# Patient Record
Sex: Female | Born: 1975 | Race: White | Hispanic: No | Marital: Married | Smoking: Never smoker
Health system: Southern US, Community
[De-identification: ages and names within clinical notes are randomized; demographics above are authoritative.]

## PROBLEM LIST (undated history)

## (undated) DIAGNOSIS — K5190 Ulcerative colitis, unspecified, without complications: Secondary | ICD-10-CM

---

## 2002-12-16 ENCOUNTER — Other Ambulatory Visit: Admission: RE | Admit: 2002-12-16 | Discharge: 2002-12-16 | Payer: Self-pay | Admitting: Obstetrics and Gynecology

## 2003-02-26 ENCOUNTER — Inpatient Hospital Stay (HOSPITAL_COMMUNITY): Admission: AD | Admit: 2003-02-26 | Discharge: 2003-02-26 | Payer: Self-pay | Admitting: Obstetrics and Gynecology

## 2003-02-26 ENCOUNTER — Encounter: Payer: Self-pay | Admitting: Obstetrics and Gynecology

## 2003-07-23 ENCOUNTER — Inpatient Hospital Stay (HOSPITAL_COMMUNITY): Admission: AD | Admit: 2003-07-23 | Discharge: 2003-07-25 | Payer: Self-pay | Admitting: Obstetrics and Gynecology

## 2003-07-28 ENCOUNTER — Encounter: Admission: RE | Admit: 2003-07-28 | Discharge: 2003-08-27 | Payer: Self-pay | Admitting: Obstetrics and Gynecology

## 2003-08-31 ENCOUNTER — Other Ambulatory Visit: Admission: RE | Admit: 2003-08-31 | Discharge: 2003-08-31 | Payer: Self-pay | Admitting: Obstetrics and Gynecology

## 2003-09-27 ENCOUNTER — Encounter: Admission: RE | Admit: 2003-09-27 | Discharge: 2003-10-27 | Payer: Self-pay | Admitting: Obstetrics and Gynecology

## 2003-11-27 ENCOUNTER — Encounter: Admission: RE | Admit: 2003-11-27 | Discharge: 2003-12-27 | Payer: Self-pay | Admitting: Obstetrics and Gynecology

## 2003-12-28 ENCOUNTER — Encounter: Admission: RE | Admit: 2003-12-28 | Discharge: 2004-01-27 | Payer: Self-pay | Admitting: Obstetrics and Gynecology

## 2004-02-25 ENCOUNTER — Encounter: Admission: RE | Admit: 2004-02-25 | Discharge: 2004-03-26 | Payer: Self-pay | Admitting: Obstetrics and Gynecology

## 2004-04-26 ENCOUNTER — Encounter: Admission: RE | Admit: 2004-04-26 | Discharge: 2004-05-26 | Payer: Self-pay | Admitting: Obstetrics and Gynecology

## 2004-06-26 ENCOUNTER — Encounter: Admission: RE | Admit: 2004-06-26 | Discharge: 2004-07-26 | Payer: Self-pay | Admitting: Obstetrics and Gynecology

## 2004-07-27 ENCOUNTER — Encounter: Admission: RE | Admit: 2004-07-27 | Discharge: 2004-08-26 | Payer: Self-pay | Admitting: Obstetrics and Gynecology

## 2004-09-20 ENCOUNTER — Other Ambulatory Visit: Admission: RE | Admit: 2004-09-20 | Discharge: 2004-09-20 | Payer: Self-pay | Admitting: Obstetrics and Gynecology

## 2005-07-17 ENCOUNTER — Other Ambulatory Visit: Admission: RE | Admit: 2005-07-17 | Discharge: 2005-07-17 | Payer: Self-pay | Admitting: Obstetrics and Gynecology

## 2006-01-22 ENCOUNTER — Inpatient Hospital Stay (HOSPITAL_COMMUNITY): Admission: AD | Admit: 2006-01-22 | Discharge: 2006-01-22 | Payer: Self-pay | Admitting: Obstetrics and Gynecology

## 2006-02-03 ENCOUNTER — Inpatient Hospital Stay (HOSPITAL_COMMUNITY): Admission: AD | Admit: 2006-02-03 | Discharge: 2006-02-05 | Payer: Self-pay | Admitting: Obstetrics and Gynecology

## 2006-02-06 ENCOUNTER — Encounter: Admission: RE | Admit: 2006-02-06 | Discharge: 2006-03-08 | Payer: Self-pay | Admitting: Obstetrics and Gynecology

## 2006-03-09 ENCOUNTER — Encounter: Admission: RE | Admit: 2006-03-09 | Discharge: 2006-04-07 | Payer: Self-pay | Admitting: Obstetrics and Gynecology

## 2006-04-08 ENCOUNTER — Encounter: Admission: RE | Admit: 2006-04-08 | Discharge: 2006-05-08 | Payer: Self-pay | Admitting: Obstetrics and Gynecology

## 2006-05-09 ENCOUNTER — Encounter: Admission: RE | Admit: 2006-05-09 | Discharge: 2006-06-07 | Payer: Self-pay | Admitting: Obstetrics and Gynecology

## 2006-06-08 ENCOUNTER — Encounter: Admission: RE | Admit: 2006-06-08 | Discharge: 2006-07-08 | Payer: Self-pay | Admitting: Obstetrics and Gynecology

## 2006-07-09 ENCOUNTER — Encounter: Admission: RE | Admit: 2006-07-09 | Discharge: 2006-08-08 | Payer: Self-pay | Admitting: Obstetrics and Gynecology

## 2006-08-09 ENCOUNTER — Encounter: Admission: RE | Admit: 2006-08-09 | Discharge: 2006-09-07 | Payer: Self-pay | Admitting: Obstetrics and Gynecology

## 2006-09-08 ENCOUNTER — Encounter: Admission: RE | Admit: 2006-09-08 | Discharge: 2006-10-08 | Payer: Self-pay | Admitting: Obstetrics and Gynecology

## 2006-10-09 ENCOUNTER — Encounter: Admission: RE | Admit: 2006-10-09 | Discharge: 2006-11-07 | Payer: Self-pay | Admitting: Obstetrics and Gynecology

## 2006-11-08 ENCOUNTER — Encounter: Admission: RE | Admit: 2006-11-08 | Discharge: 2006-12-08 | Payer: Self-pay | Admitting: Obstetrics and Gynecology

## 2006-12-09 ENCOUNTER — Encounter: Admission: RE | Admit: 2006-12-09 | Discharge: 2007-01-08 | Payer: Self-pay | Admitting: Obstetrics and Gynecology

## 2007-01-09 ENCOUNTER — Encounter: Admission: RE | Admit: 2007-01-09 | Discharge: 2007-02-06 | Payer: Self-pay | Admitting: Obstetrics and Gynecology

## 2007-02-07 ENCOUNTER — Encounter: Admission: RE | Admit: 2007-02-07 | Discharge: 2007-03-09 | Payer: Self-pay | Admitting: Obstetrics and Gynecology

## 2007-03-10 ENCOUNTER — Encounter: Admission: RE | Admit: 2007-03-10 | Discharge: 2007-04-08 | Payer: Self-pay | Admitting: Obstetrics and Gynecology

## 2007-04-09 ENCOUNTER — Encounter: Admission: RE | Admit: 2007-04-09 | Discharge: 2007-05-09 | Payer: Self-pay | Admitting: Obstetrics and Gynecology

## 2007-05-10 ENCOUNTER — Encounter: Admission: RE | Admit: 2007-05-10 | Discharge: 2007-06-08 | Payer: Self-pay | Admitting: Obstetrics and Gynecology

## 2011-01-29 ENCOUNTER — Other Ambulatory Visit: Payer: Self-pay | Admitting: Family Medicine

## 2011-01-29 ENCOUNTER — Ambulatory Visit
Admission: RE | Admit: 2011-01-29 | Discharge: 2011-01-29 | Disposition: A | Discharge status: Home / Self Care | Payer: 59 | Source: Ambulatory Visit | Attending: Family Medicine | Admitting: Family Medicine

## 2011-01-29 DIAGNOSIS — R911 Solitary pulmonary nodule: Secondary | ICD-10-CM

## 2012-07-06 ENCOUNTER — Other Ambulatory Visit: Payer: Self-pay | Admitting: Obstetrics and Gynecology

## 2012-07-06 DIAGNOSIS — R928 Other abnormal and inconclusive findings on diagnostic imaging of breast: Secondary | ICD-10-CM

## 2012-07-10 ENCOUNTER — Ambulatory Visit
Admission: RE | Admit: 2012-07-10 | Discharge: 2012-07-10 | Disposition: A | Discharge status: Home / Self Care | Payer: 59 | Source: Ambulatory Visit | Attending: Obstetrics and Gynecology | Admitting: Obstetrics and Gynecology

## 2012-07-10 DIAGNOSIS — R928 Other abnormal and inconclusive findings on diagnostic imaging of breast: Secondary | ICD-10-CM

## 2018-10-04 ENCOUNTER — Emergency Department (HOSPITAL_BASED_OUTPATIENT_CLINIC_OR_DEPARTMENT_OTHER): Payer: 59

## 2018-10-04 ENCOUNTER — Other Ambulatory Visit: Payer: Self-pay

## 2018-10-04 ENCOUNTER — Emergency Department (HOSPITAL_BASED_OUTPATIENT_CLINIC_OR_DEPARTMENT_OTHER)
Admission: EM | Admit: 2018-10-04 | Discharge: 2018-10-04 | Disposition: A | Discharge status: Home / Self Care | Payer: 59 | Attending: Emergency Medicine | Admitting: Emergency Medicine

## 2018-10-04 ENCOUNTER — Encounter: Payer: Self-pay | Admitting: Emergency Medicine

## 2018-10-04 DIAGNOSIS — R1032 Left lower quadrant pain: Secondary | ICD-10-CM | POA: Diagnosis present

## 2018-10-04 DIAGNOSIS — R1012 Left upper quadrant pain: Secondary | ICD-10-CM | POA: Insufficient documentation

## 2018-10-04 HISTORY — DX: Ulcerative colitis, unspecified, without complications: K51.90

## 2018-10-04 LAB — CBC WITH DIFFERENTIAL/PLATELET
Abs Immature Granulocytes: 0.02 10*3/uL (ref 0.00–0.07)
BASOS ABS: 0 10*3/uL (ref 0.0–0.1)
Basophils Relative: 1 %
EOS PCT: 6 %
Eosinophils Absolute: 0.3 10*3/uL (ref 0.0–0.5)
HEMATOCRIT: 39.6 % (ref 36.0–46.0)
HEMOGLOBIN: 12.2 g/dL (ref 12.0–15.0)
Immature Granulocytes: 1 %
LYMPHS ABS: 1.3 10*3/uL (ref 0.7–4.0)
LYMPHS PCT: 31 %
MCH: 26.6 pg (ref 26.0–34.0)
MCHC: 30.8 g/dL (ref 30.0–36.0)
MCV: 86.5 fL (ref 80.0–100.0)
Monocytes Absolute: 0.2 10*3/uL (ref 0.1–1.0)
Monocytes Relative: 6 %
NEUTROS ABS: 2.3 10*3/uL (ref 1.7–7.7)
NRBC: 0 % (ref 0.0–0.2)
Neutrophils Relative %: 55 %
Platelets: 344 10*3/uL (ref 150–400)
RBC: 4.58 MIL/uL (ref 3.87–5.11)
RDW: 13.2 % (ref 11.5–15.5)
WBC: 4.1 10*3/uL (ref 4.0–10.5)

## 2018-10-04 LAB — URINALYSIS, ROUTINE W REFLEX MICROSCOPIC
BILIRUBIN URINE: NEGATIVE
GLUCOSE, UA: NEGATIVE mg/dL
Hgb urine dipstick: NEGATIVE
Ketones, ur: NEGATIVE mg/dL
Leukocytes, UA: NEGATIVE
NITRITE: NEGATIVE
PH: 6.5 (ref 5.0–8.0)
Protein, ur: NEGATIVE mg/dL
SPECIFIC GRAVITY, URINE: 1.02 (ref 1.005–1.030)

## 2018-10-04 LAB — COMPREHENSIVE METABOLIC PANEL
ALBUMIN: 4 g/dL (ref 3.5–5.0)
ALK PHOS: 55 U/L (ref 38–126)
ALT: 20 U/L (ref 0–44)
ANION GAP: 5 (ref 5–15)
AST: 21 U/L (ref 15–41)
BILIRUBIN TOTAL: 0.6 mg/dL (ref 0.3–1.2)
BUN: 11 mg/dL (ref 6–20)
CALCIUM: 9.2 mg/dL (ref 8.9–10.3)
CO2: 25 mmol/L (ref 22–32)
Chloride: 107 mmol/L (ref 98–111)
Creatinine, Ser: 0.83 mg/dL (ref 0.44–1.00)
GFR calc Af Amer: >60 mL/min (ref >60)
GFR calc non Af Amer: >60 mL/min (ref >60)
Glucose, Bld: 91 mg/dL (ref 70–99)
Potassium: 3.8 mmol/L (ref 3.5–5.1)
SODIUM: 137 mmol/L (ref 135–145)
TOTAL PROTEIN: 7.2 g/dL (ref 6.5–8.1)

## 2018-10-04 LAB — TROPONIN I

## 2018-10-04 LAB — PREGNANCY, URINE: Preg Test, Ur: NEGATIVE

## 2018-10-04 LAB — LIPASE, BLOOD: Lipase: 26 U/L (ref 11–51)

## 2018-10-04 MED ORDER — SUCRALFATE 1 GM/10ML PO SUSP: 1.0000 g | Freq: Three times a day (TID) | ORAL | 0 refills | Status: AC

## 2018-10-04 MED ORDER — ONDANSETRON HCL 4 MG/2ML IJ SOLN
4.0000 mg | Freq: Once | INTRAMUSCULAR | Status: AC
  Administered 2018-10-04: 4 mg via INTRAVENOUS
  Filled 2018-10-04: qty 2

## 2018-10-04 MED ORDER — ALUM & MAG HYDROXIDE-SIMETH 200-200-20 MG/5ML PO SUSP
30.0000 mL | Freq: Once | ORAL | Status: AC
  Administered 2018-10-04: 30 mL via ORAL
  Filled 2018-10-04: qty 30

## 2018-10-04 MED ORDER — FENTANYL CITRATE (PF) 100 MCG/2ML IJ SOLN
50.0000 ug | Freq: Once | INTRAMUSCULAR | Status: AC
  Administered 2018-10-04: 50 ug via INTRAVENOUS
  Filled 2018-10-04: qty 2

## 2018-10-04 MED ORDER — ONDANSETRON HCL 4 MG PO TABS: 4.0000 mg | ORAL_TABLET | Freq: Four times a day (QID) | ORAL | 0 refills | Status: AC

## 2018-10-04 MED ORDER — ALBUTEROL (5 MG/ML) CONTINUOUS INHALATION SOLN
INHALATION_SOLUTION | RESPIRATORY_TRACT | Status: AC
  Filled 2018-10-04: qty 20

## 2018-10-04 MED ORDER — IOPAMIDOL (ISOVUE-300) INJECTION 61%
100.0000 mL | Freq: Once | INTRAVENOUS | Status: AC | PRN
  Administered 2018-10-04: 100 mL via INTRAVENOUS

## 2018-10-04 MED ORDER — SODIUM CHLORIDE 0.9 % IV BOLUS
1000.0000 mL | Freq: Once | INTRAVENOUS | Status: AC
  Administered 2018-10-04: 1000 mL via INTRAVENOUS

## 2018-10-04 MED ORDER — LIDOCAINE VISCOUS HCL 2 % MT SOLN
15.0000 mL | Freq: Once | OROMUCOSAL | Status: AC
  Administered 2018-10-04: 15 mL via ORAL
  Filled 2018-10-04: qty 15

## 2018-10-04 MED ORDER — MORPHINE SULFATE (PF) 4 MG/ML IV SOLN
4.0000 mg | Freq: Once | INTRAVENOUS | Status: AC
  Administered 2018-10-04: 4 mg via INTRAVENOUS
  Filled 2018-10-04: qty 1

## 2018-10-04 MED ORDER — OMEPRAZOLE 20 MG PO CPDR: 20.0000 mg | DELAYED_RELEASE_CAPSULE | Freq: Two times a day (BID) | ORAL | 0 refills | Status: AC

## 2018-10-04 NOTE — Discharge Instructions (Signed)
Take the omeprazole and Carafate as directed.  Take Zofran as directed.  Follow-up with Dr. Marigene EhlersLee's office.  Call his office tomorrow to arrange for an appointment.  Return to emergency department for any worsening pain, fevers, difficulty breathing, chest pain or any other worsening concerning symptoms.

## 2018-10-04 NOTE — ED Triage Notes (Signed)
Presents with left flank and hip pain that began last night and is consistent. The patient has been unable to get comfortable/ Bending forward and rolling over make pain worse. Laying on stomach make pain better. Endorses nausea and vomiting. History of ulcerative colitis.

## 2018-10-04 NOTE — ED Provider Notes (Signed)
[facility] EMERGENCY DEPARTMENT Provider Note   CSN: [id number] Arrival date & time: 10/04/18  1102     History   Chief Complaint Chief Complaint  Patient presents with  . Flank Pain    HPI Holly Macias is a 42 y.o. female possible history of ulcerative colitis who presents for evaluation of left flank pain that is now rating up to the left upper quadrant pain that began yesterday.  Patient reports that she started having some left flank pain that radiated around to the left side, left hip.  She reports that overnight, the pain got worse and started radiating up towards her left abdomen and into her left upper quadrant/gastric region.  She states that this extends up into underneath her breast.  Her pain is not worse with deep inspiration or with exertion.  She denies any associated shortness of breath.  She states that the pain is worse when she moves or bends.  Patient states that the pain was also worsened by eating.  She reports nausea and vomiting.  Emesis was nonbloody, nonbilious.  Patient reports she did not take any medication for the pain.  Patient reports she is also having some heartburn-like symptoms where she feels like there is burning coming from the pain in her left upper quadrant.  Her pain is not worse with deep inspiration.  She denies any associated shortness of breath.  Patient states that she has not had any fever since onset of symptoms.  Patient denies any chest pain, difficulty breathing, dysuria, hematuria.  Her last bowel movement was this morning and was normal.  She did have blood in stool but she states that is normal for her.  She denies any OCP use, recent immobilization, prior history of DVT/PE, recent surgery, leg swelling, or long travel.  The history is provided by the patient.    Past Medical History:  Diagnosis Date  . Ulcerative colitis (HCC)     There are no active problems to display for this patient.   History reviewed. No  pertinent surgical history.   OB History   None      Home Medications    Prior to Admission medications   Medication Sig Start Date End Date Taking? Authorizing Provider  omeprazole (PRILOSEC) 20 MG capsule Take 1 capsule (20 mg total) by mouth 2 (two) times daily before a meal for 15 days. 10/04/18 10/19/18  Graciella FreerLayden, Lindsey A, PA-C  ondansetron (ZOFRAN) 4 MG tablet Take 1 tablet (4 mg total) by mouth every 6 (six) hours. 10/04/18   Maxwell CaulLayden, Lindsey A, PA-C  sucralfate (CARAFATE) 1 GM/10ML suspension Take 10 mLs (1 g total) by mouth 4 (four) times daily -  with meals and at bedtime. 10/04/18   Maxwell CaulLayden, Lindsey A, PA-C    Family History History reviewed. No pertinent family history.  Social History Social History   Tobacco Use  . Smoking status: Never Smoker  . Smokeless tobacco: Never Used  Substance Use Topics  . Alcohol use: Not Currently  . Drug use: Never     Allergies   Patient has no known allergies.   Review of Systems Review of Systems  Constitutional: Negative for fever.  Respiratory: Negative for cough and shortness of breath.   Cardiovascular: Negative for chest pain.  Gastrointestinal: Positive for abdominal pain, blood in stool, nausea and vomiting. Negative for constipation and diarrhea.  Genitourinary: Negative for dysuria and hematuria.  Neurological: Negative for headaches.  All other systems reviewed and are negative.  Physical Exam Updated Vital Signs BP 110/63   Pulse 73   Temp 98.1 F (36.7 C) (Oral)   Resp 19   Ht 5\' 2"  (1.575 m)   Wt 83.9 kg   SpO2 100%   BMI 33.84 kg/m   Physical Exam  Constitutional: She is oriented to person, place, and time. She appears well-developed and well-nourished.  HENT:  Head: Normocephalic and atraumatic.  Mouth/Throat: Oropharynx is clear and moist and mucous membranes are normal.  Eyes: Pupils are equal, round, and reactive to light. Conjunctivae, EOM and lids are normal.  Neck: Full passive range  of motion without pain.  Cardiovascular: Normal rate, regular rhythm, normal heart sounds and normal pulses. Exam reveals no gallop and no friction rub.  No murmur heard. Pulmonary/Chest: Effort normal and breath sounds normal.  Lungs clear to auscultation bilaterally.  Symmetric chest rise.  No wheezing, rales, rhonchi.  Abdominal: Soft. Normal appearance and bowel sounds are normal. There is generalized tenderness and tenderness in the left upper quadrant and left lower quadrant. There is CVA tenderness. There is no rigidity, no guarding and no tenderness at McBurney's point.  Abdomen is soft, nondistended.  Tenderness noted throughout the entire abdomen but is worse in the left upper quadrant.  Bilateral mild CVA tenderness, left greater than right.  Musculoskeletal: Normal range of motion.  Neurological: She is alert and oriented to person, place, and time.  Skin: Skin is warm and dry. Capillary refill takes less than 2 seconds.  Psychiatric: She has a normal mood and affect. Her speech is normal.  Nursing note and vitals reviewed.    ED Treatments / Results  Labs (all labs ordered are listed, but only abnormal results are displayed) Labs Reviewed  CBC WITH DIFFERENTIAL/PLATELET  COMPREHENSIVE METABOLIC PANEL  LIPASE, BLOOD  URINALYSIS, ROUTINE W REFLEX MICROSCOPIC  PREGNANCY, URINE  TROPONIN I    EKG None  Radiology Dg Chest 2 View  Result Date: 10/04/2018 CLINICAL DATA:  LEFT rib pain since last night. EXAM: CHEST - 2 VIEW COMPARISON:  Chest radiograph January 29, 2011 FINDINGS: Cardiomediastinal silhouette is normal. No pleural effusions or focal consolidations. Trachea projects midline and there is no pneumothorax. Soft tissue planes and included osseous structures are non-suspicious. IMPRESSION: Normal chest. Electronically Signed   By: Awilda Metro M.D.   On: 10/04/2018 14:21   Ct Abdomen Pelvis W Contrast  Result Date: 10/04/2018 CLINICAL DATA:  LEFT hip pain  radiating to LEFT flank for 2 days. History of ulcerative colitis. EXAM: CT ABDOMEN AND PELVIS WITH CONTRAST TECHNIQUE: Multidetector CT imaging of the abdomen and pelvis was performed using the standard protocol following bolus administration of intravenous contrast. CONTRAST:  ISOVUE-300 IOPAMIDOL (ISOVUE-300) INJECTION 61% COMPARISON:  None. FINDINGS: LOWER CHEST: Lung bases are clear. Included heart size is normal. No pericardial effusion. HEPATOBILIARY: Liver and gallbladder are normal. Mild focal fatty infiltration about the falciform ligament. PANCREAS: Normal. SPLEEN: Normal. ADRENALS/URINARY TRACT: Kidneys are orthotopic, demonstrating symmetric enhancement. No nephrolithiasis, hydronephrosis or solid renal masses. Too small to characterize hypodensity lower pole LEFT kidney. The unopacified ureters are normal in course and caliber. Delayed imaging through the kidneys demonstrates symmetric prompt contrast excretion within the proximal urinary collecting system. Urinary bladder is partially distended and unremarkable. Normal adrenal glands. STOMACH/BOWEL: The stomach, small and large bowel are normal in course and caliber without inflammatory changes. Mild rectal wall thickening though assessment limited by lack of oral contrast. Normal appendix. VASCULAR/LYMPHATIC: Aortoiliac vessels are normal in course and caliber.  No lymphadenopathy by CT size criteria. Small perirectal lymph nodes are likely reactive. REPRODUCTIVE: Normal.  IUD in central uterus. OTHER: No intraperitoneal free fluid or free air. MUSCULOSKELETAL: Nonacute. Mild levoscoliosis on this nonweightbearing examination. Mild lower lumbar facet arthropathy. Mild symmetric degenerative change of the hips. IMPRESSION: 1. Mild rectal wall thickening without acute inflammation or acute intra-abdominal/pelvic process. Electronically Signed   By: Awilda Metro M.D.   On: 10/04/2018 14:19    Procedures Procedures (including critical care  time)  Medications Ordered in ED Medications  albuterol (PROVENTIL, VENTOLIN) (5 MG/ML) 0.5% continuous inhalation solution (has no administration in time range)  sodium chloride 0.9 % bolus 1,000 mL (0 mLs Intravenous Stopped 10/04/18 1516)  morphine 4 MG/ML injection 4 mg (4 mg Intravenous Given 10/04/18 1314)  ondansetron (ZOFRAN) injection 4 mg (4 mg Intravenous Given 10/04/18 1313)  alum & mag hydroxide-simeth (MAALOX/MYLANTA) 200-200-20 MG/5ML suspension 30 mL (30 mLs Oral Given 10/04/18 1324)    And  lidocaine (XYLOCAINE) 2 % viscous mouth solution 15 mL (15 mLs Oral Given 10/04/18 1324)  iopamidol (ISOVUE-300) 61 % injection 100 mL (100 mLs Intravenous Contrast Given 10/04/18 1341)  fentaNYL (SUBLIMAZE) injection 50 mcg (50 mcg Intravenous Given 10/04/18 1707)     Initial Impression / Assessment and Plan / ED Course  I have reviewed the triage vital signs and the nursing notes.  Pertinent labs & imaging results that were available during my care of the patient were reviewed by me and considered in my medical decision making (see chart for details).     42 year old female past with history of ulcerative colitis who presents for evaluation of abdominal pain her.  Reports initially was left flank pain that began last night and then went into the lower abdomen.  Since last night, started radiating up into the left upper quadrant, epigastric region just underneath her breast.  States it does not extend into her chest.  Pain not worse with deep inspiration or exertion.  She has no associated difficulty breathing.  She does report some associated nausea and vomiting.  No urinary complaints. Patient is afebrile, non-toxic appearing, sitting comfortably on examination table. Vital signs reviewed and stable.  On exam, she has tenderness noted to the CVA left greater than right.  Also with some tenderness noted to the left lateral side, left upper quadrant.  No McBurney's point tenderness.  No  lower abdominal tenderness.  Consider ulcerative colitis flare versus peptic ulcer disease versus infectious process.  History/physical exam is not concerning for ACS etiology but given the radiation, will plan to check EKG, troponin.  Not suspect PE given history/physical exam. Patient is PERC negative and is low risk for PE.  History/physical exam is not concerning for appendicitis, OB/GYN allergy.  Do not suspect ovarian torsion given history/physical exam.  Lipase normal.  CBC without any significant leukocytosis or anemia.  CMP is unremarkable.  UA without any infectious etiology. Urine pregnancy negative.  Troponin negative.  Chest x-ray negative for any acute infectious etiology.  CT abdomen pelvis shows normal appendix.  There is mild rectal wall thickening without acute inflammation or acute intra-abdominal pelvic process.  Reevaluation.  Patient reports improvement after GI cocktail and pain medication here in the ED.  We will plan to p.o. challenge.  Discussed patient with Dr. Higinio Roger (GI).  He recommends treating as possible ulcer with PPI and Carafate.  No indication to start any steroid or any other treatment today here in the ED.  He recommends that patient  call GI practice tomorrow morning to arrange an appointment with Dr. Nedra Hai.  Reevaluation.  Patient reports her pain started coming back after drinking but she has not had any vomiting.  I discussed with her regarding GIs recommendations.  Patient comfortable with this plan.  Repeat abdominal exam is improved.  Vital signs stable.  Patient stable for discharge at this time. Patient had ample opportunity for questions and discussion. All patient's questions were answered with full understanding. Strict return precautions discussed. Patient expresses understanding and agreement to plan.    Final Clinical Impressions(s) / ED Diagnoses   Final diagnoses:  Left upper quadrant pain    ED Discharge Orders         Ordered    omeprazole  (PRILOSEC) 20 MG capsule  2 times daily before meals     10/04/18 1649    sucralfate (CARAFATE) 1 GM/10ML suspension  3 times daily with meals & bedtime     10/04/18 1649    ondansetron (ZOFRAN) 4 MG tablet  Every 6 hours     10/04/18 1654           Maxwell Caul, PA-C 10/04/18 1938    Alvira Monday, MD 10/07/18 [phone]

## 2019-12-14 ENCOUNTER — Other Ambulatory Visit: Payer: Self-pay | Admitting: Obstetrics and Gynecology

## 2019-12-14 DIAGNOSIS — R928 Other abnormal and inconclusive findings on diagnostic imaging of breast: Secondary | ICD-10-CM

## 2019-12-15 ENCOUNTER — Ambulatory Visit
Admission: RE | Admit: 2019-12-15 | Discharge: 2019-12-15 | Disposition: A | Discharge status: Home / Self Care | Payer: 59 | Source: Ambulatory Visit | Attending: Obstetrics and Gynecology | Admitting: Obstetrics and Gynecology

## 2019-12-15 ENCOUNTER — Other Ambulatory Visit: Payer: Self-pay

## 2019-12-15 DIAGNOSIS — R928 Other abnormal and inconclusive findings on diagnostic imaging of breast: Secondary | ICD-10-CM

## 2021-07-02 ENCOUNTER — Other Ambulatory Visit: Payer: Self-pay | Admitting: Obstetrics and Gynecology

## 2021-07-02 DIAGNOSIS — R928 Other abnormal and inconclusive findings on diagnostic imaging of breast: Secondary | ICD-10-CM

## 2021-07-13 ENCOUNTER — Other Ambulatory Visit: Payer: Self-pay | Admitting: Obstetrics and Gynecology

## 2021-07-13 ENCOUNTER — Ambulatory Visit
Admission: RE | Admit: 2021-07-13 | Discharge: 2021-07-13 | Disposition: A | Discharge status: Home / Self Care | Payer: BC Managed Care – PPO | Source: Ambulatory Visit | Attending: Obstetrics and Gynecology | Admitting: Obstetrics and Gynecology

## 2021-07-13 ENCOUNTER — Other Ambulatory Visit: Payer: Self-pay

## 2021-07-13 DIAGNOSIS — R921 Mammographic calcification found on diagnostic imaging of breast: Secondary | ICD-10-CM

## 2021-07-13 DIAGNOSIS — R928 Other abnormal and inconclusive findings on diagnostic imaging of breast: Secondary | ICD-10-CM

## 2021-07-23 ENCOUNTER — Ambulatory Visit
Admission: RE | Admit: 2021-07-23 | Discharge: 2021-07-23 | Disposition: A | Discharge status: Home / Self Care | Payer: 59 | Source: Ambulatory Visit | Attending: Obstetrics and Gynecology | Admitting: Obstetrics and Gynecology

## 2021-07-23 ENCOUNTER — Other Ambulatory Visit: Payer: Self-pay

## 2021-07-23 DIAGNOSIS — R921 Mammographic calcification found on diagnostic imaging of breast: Secondary | ICD-10-CM

## 2023-04-08 IMAGING — MG MM BREAST LOCALIZATION CLIP
4 series · 4 of 12 positions shown · non-contrast
Comparison: Previous exam(s).

CLINICAL DATA: Confirmation of clip placement after stereotactic
tomosynthesis core needle biopsy (x 2) of calcifications involving
LOWER OUTER QUADRANT and the LOWER INNER QUADRANT of the RIGHT
breast.

EXAM:
2D and 3D DIAGNOSTIC RIGHT MAMMOGRAM POST STEREOTACTIC TOMOSYNTHESIS
BIOPSY

[R ML synth-2D]
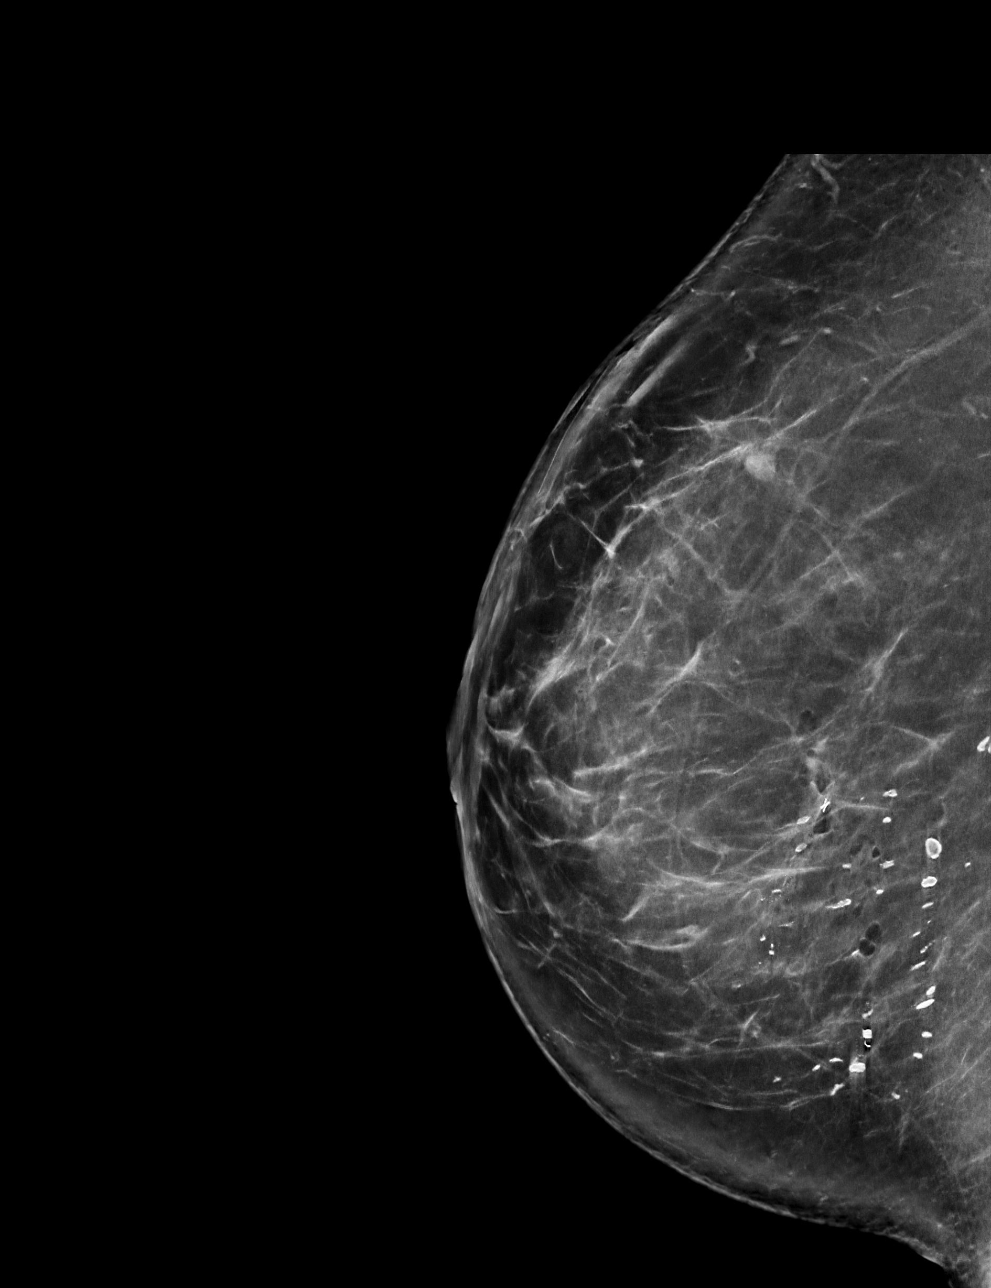

[R CC synth-2D]
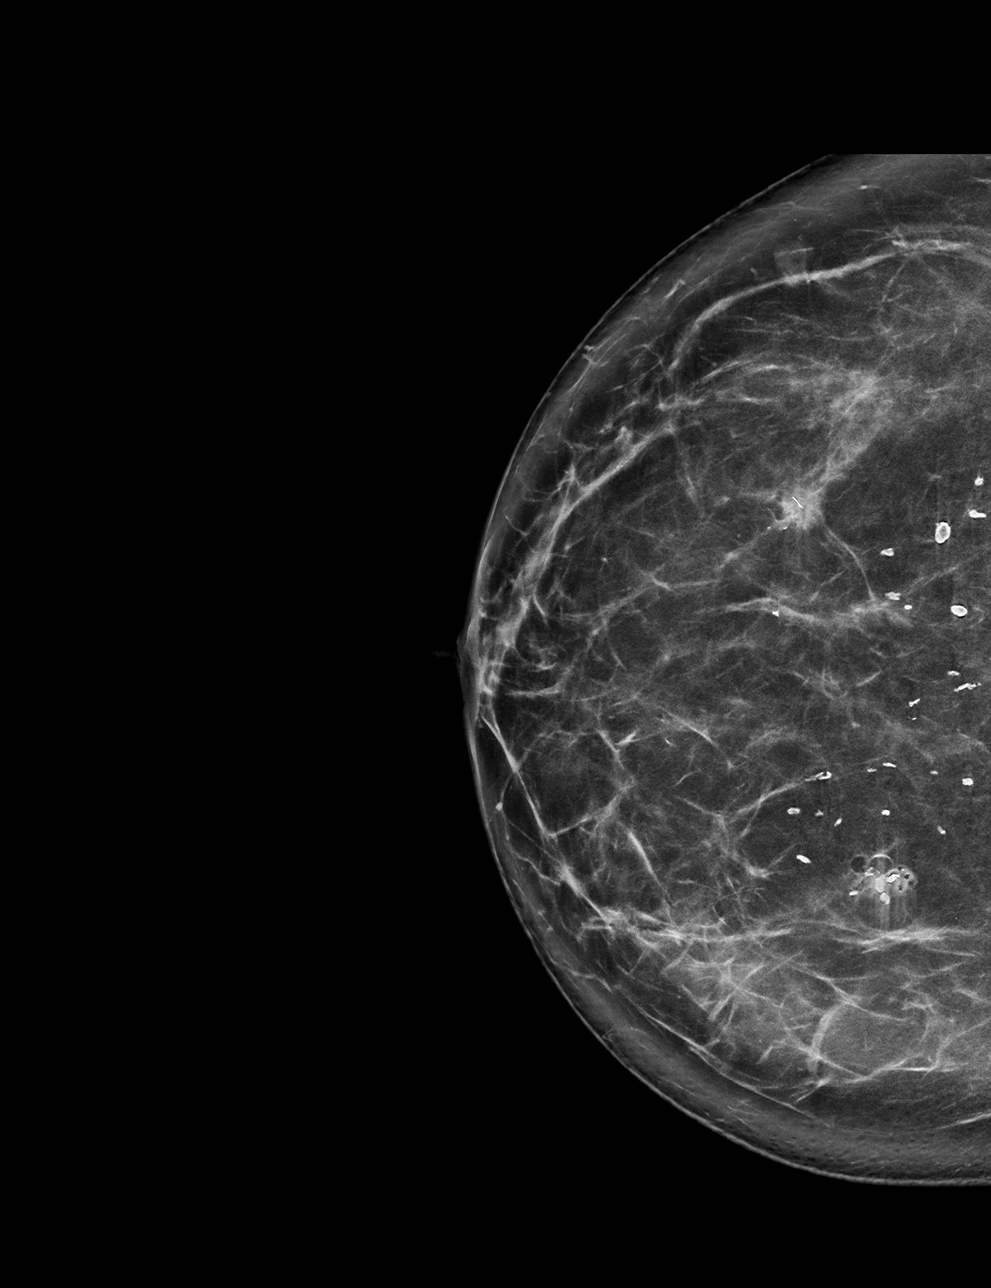

[R CC tomo · tomo slice 41/81.0]
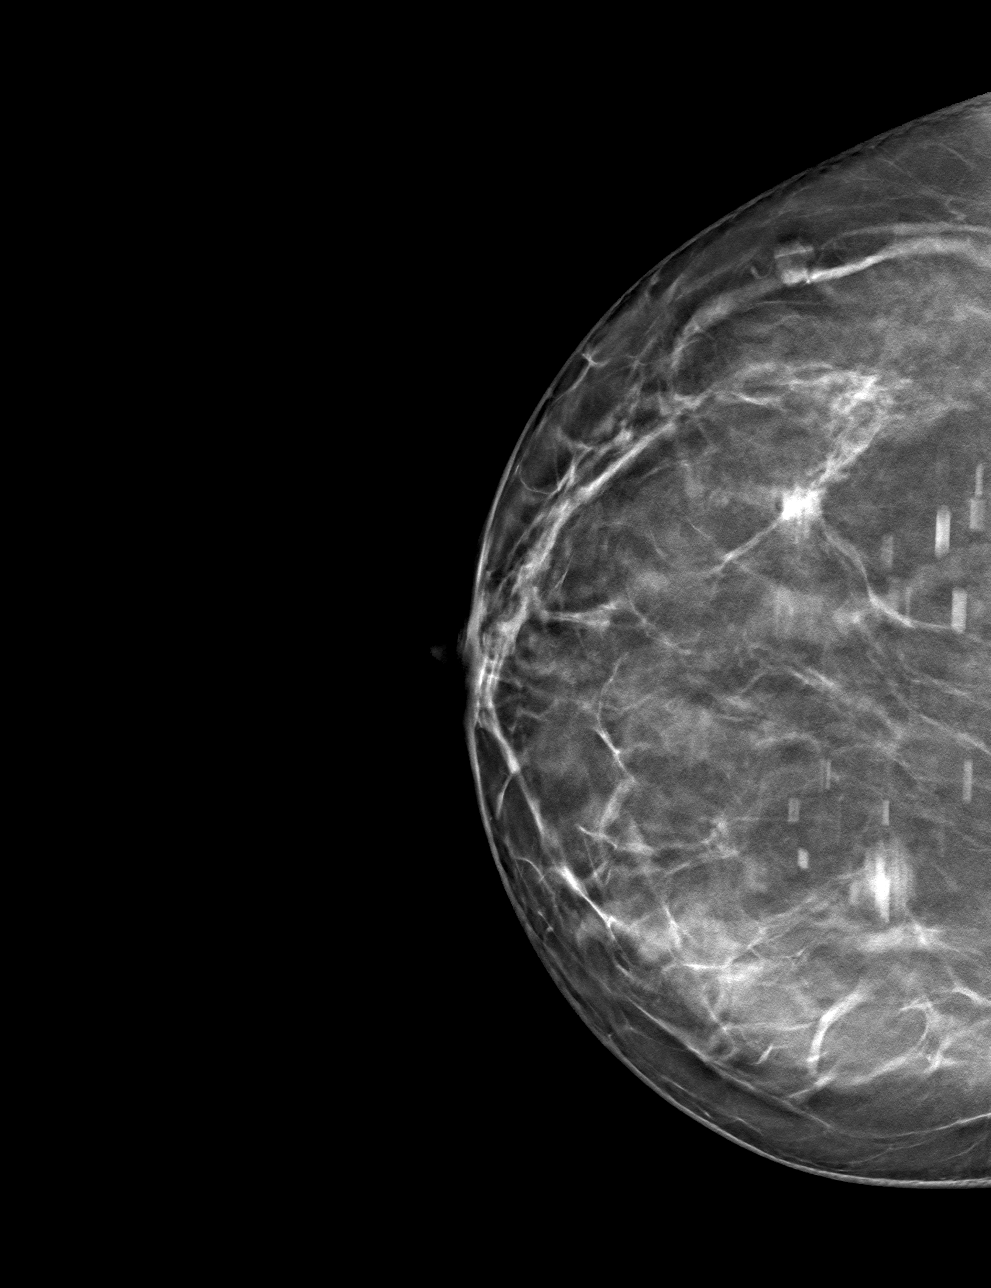

[R ML tomo · tomo slice 47/94.0]
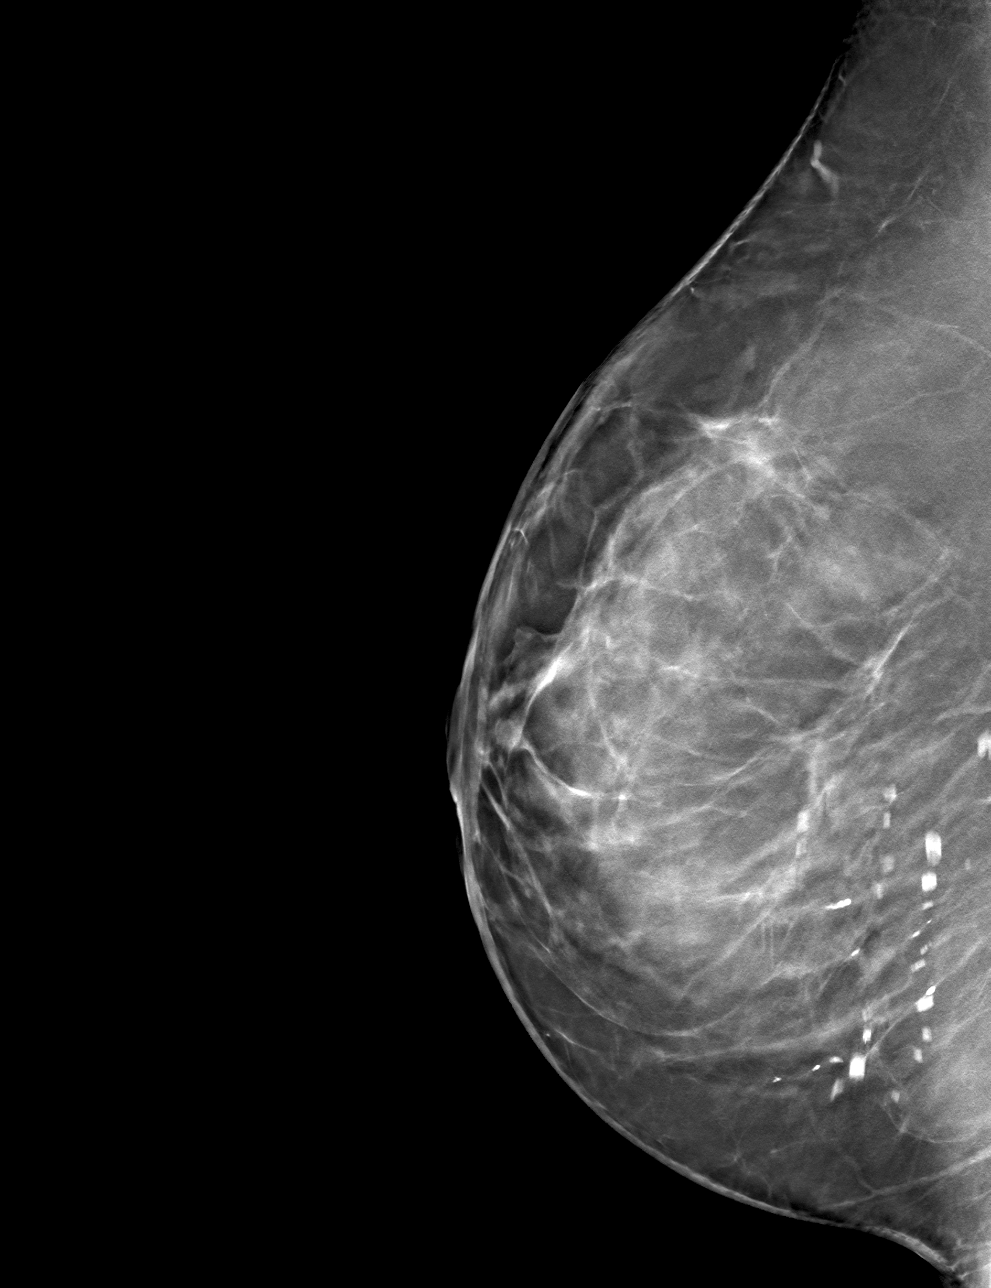

[4 of 12 positions shown; findings below may reference images not displayed]

FINDINGS: Tomosynthesis and synthesized full field CC and mediolateral images
with tomosynthesis were obtained following stereotactic
tomosynthesis guided biopsy of calcifications involving the LOWER
OUTER QUADRANT of the RIGHT breast and calcifications involving
LOWER INNER QUADRANT the RIGHT breast.

The X shaped tissue marking clip is appropriately positioned at the
site of the biopsied calcifications in the LOWER OUTER QUADRANT at
middle depth. There are a few residual calcifications at the biopsy
site.

The coil shaped tissue marking clip is appropriately positioned at
the site of the biopsied calcifications in the LOWER INNER QUADRANT
at middle depth. There are a few residual calcifications at the
biopsy site.

The clips are approximately 6.3 cm apart.

Expected post biopsy changes are present at both sites without
evidence of hematoma.
IMPRESSION: 1. Appropriate positioning of the X shaped biopsy marking clip at
the site of the biopsied calcifications in the LOWER OUTER QUADRANT
of the RIGHT breast at middle depth.
2. Appropriate positioning of the coil shaped biopsy marking clip at
the site of the biopsied calcifications in the LOWER INNER QUADRANT
of the RIGHT breast at middle depth.
3. The clips are approximately 6.3 cm apart.

Final Assessment: Post Procedure Mammograms for Marker Placement
